# Patient Record
Sex: Male | Born: 1970 | Race: White | Hispanic: No | Marital: Married | Smoking: Never smoker
Health system: Southern US, Community
[De-identification: ages and names within clinical notes are randomized; demographics above are authoritative.]

---

## 2006-05-04 ENCOUNTER — Ambulatory Visit: Payer: Self-pay | Admitting: General Practice

## 2009-11-18 ENCOUNTER — Ambulatory Visit: Payer: Self-pay | Admitting: Unknown Physician Specialty

## 2010-03-28 ENCOUNTER — Ambulatory Visit: Payer: Self-pay | Admitting: Unknown Physician Specialty

## 2010-04-28 ENCOUNTER — Ambulatory Visit: Payer: Self-pay | Admitting: Unknown Physician Specialty

## 2010-05-18 ENCOUNTER — Ambulatory Visit: Payer: Self-pay | Admitting: Unknown Physician Specialty

## 2011-08-21 IMAGING — CR DG C-ARM 1-60 MIN
1 series · 1 of 1 positions shown · non-contrast
Comparison: none

[[id]]
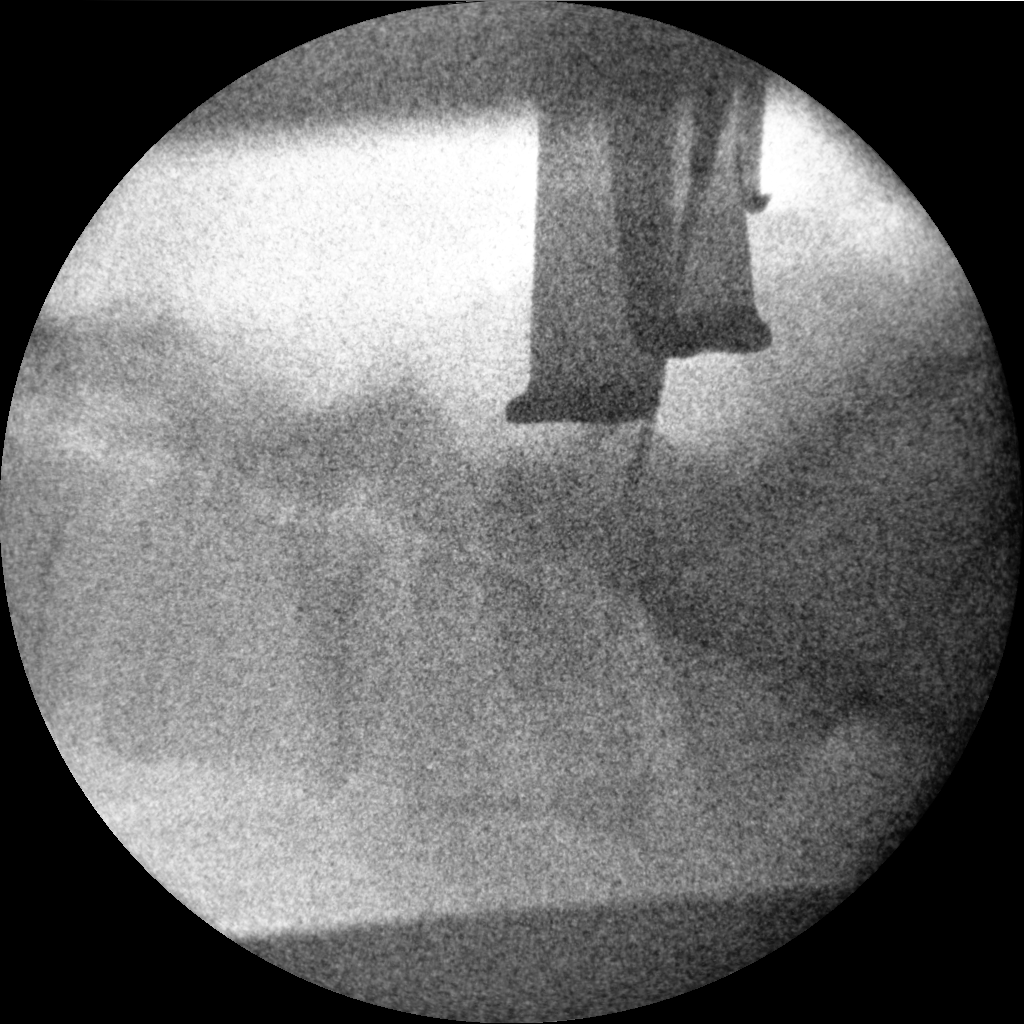

[1 of 1 positions shown; findings below may reference images not displayed]

IMAGES IMPORTED FROM THE [employer] WORKFLOW SYSTEM
NO DICTATION FOR STUDY

## 2013-10-08 HISTORY — PX: BACK SURGERY: SHX140

## 2014-05-03 ENCOUNTER — Ambulatory Visit: Payer: Self-pay | Admitting: Internal Medicine

## 2014-10-08 HISTORY — PX: ACHILLES TENDON REPAIR: SUR1153

## 2015-08-06 IMAGING — CT CT HEAD WITHOUT CONTRAST
1 series · 15 of 30 positions shown, 19 images · non-contrast
Comparison: None.

CLINICAL DATA: Lightheadedness and dizziness for the past 3-4 weeks
with nausea. Three episodes of hitting head over the past month.
Treatment of sinus infection with improvement of symptoms. No
history of cancer.

EXAM:
CT HEAD WITHOUT CONTRAST
TECHNIQUE: Contiguous axial images were obtained from the base of the skull
through the vertex without intravenous contrast.

[Series 2: head wo · axial · 0.42mm/px · z∈[-48,+87]mm · 15 of 30 slices shown, 19 images]
[im 2/30  brain]
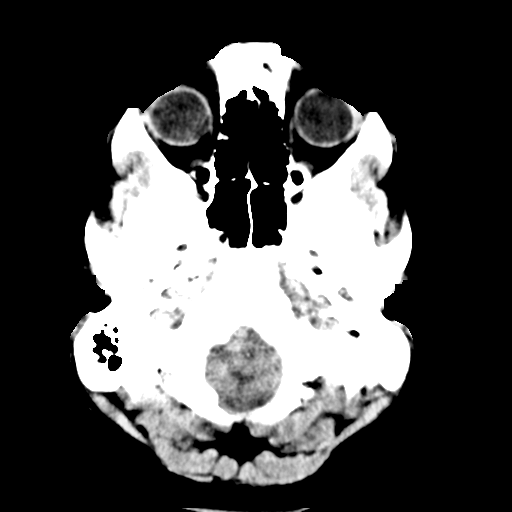
[im 2/30  bone]
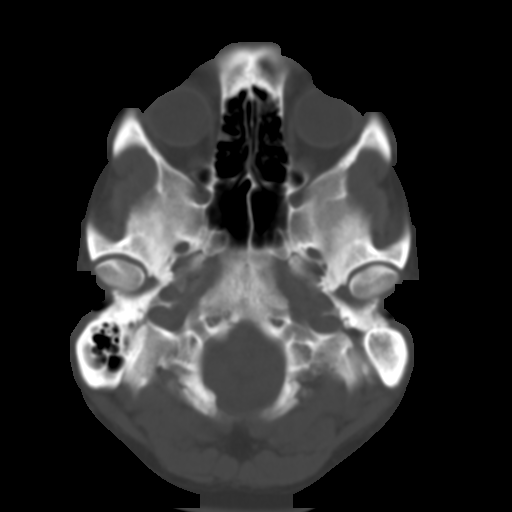
[im 4/30  brain]
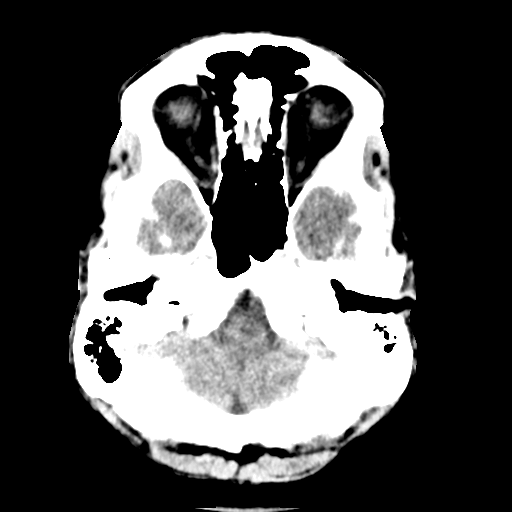
[im 6/30  brain]
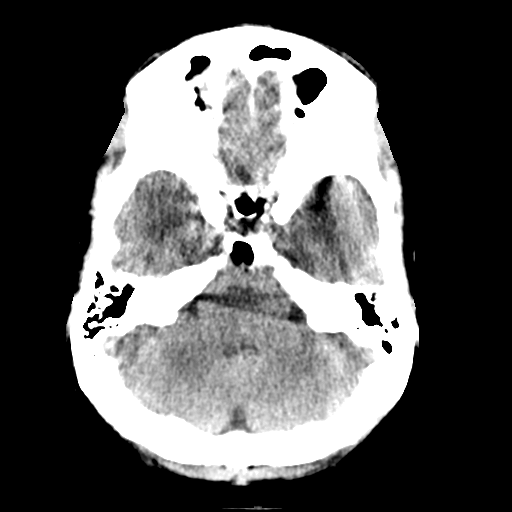
[im 8/30  brain]
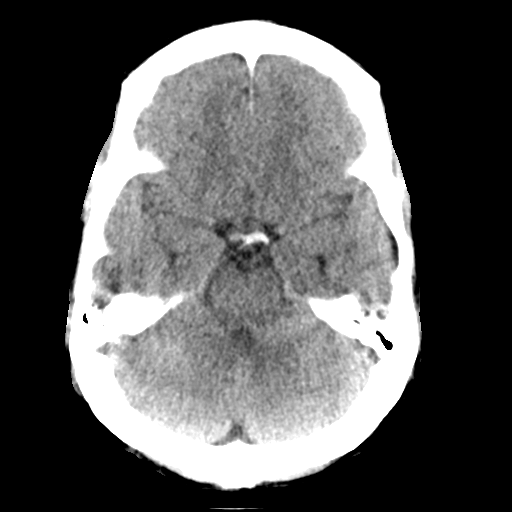
[im 10/30  brain]
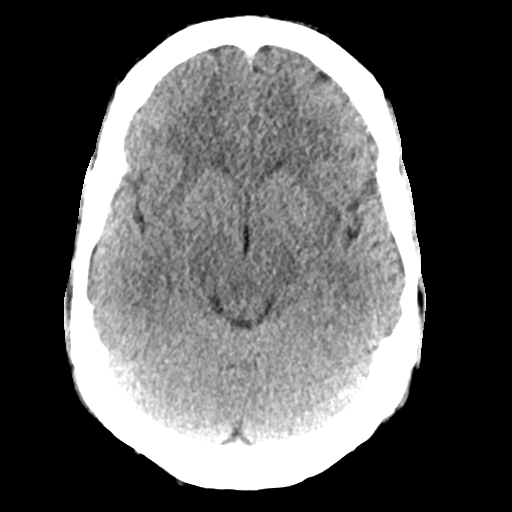
[im 10/30  bone]
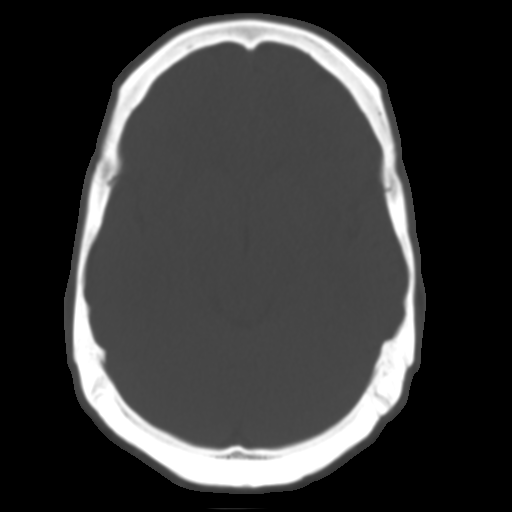
[im 12/30  brain]
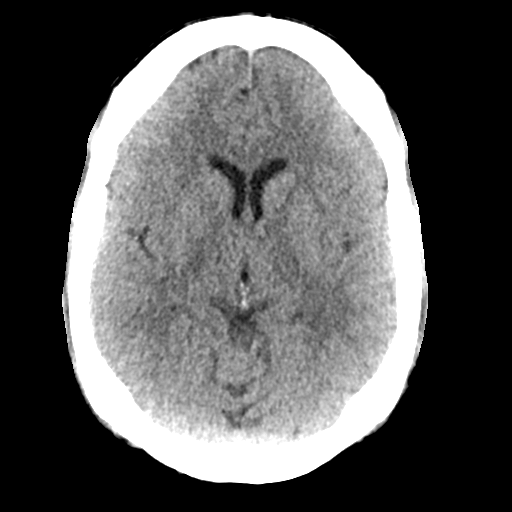
[im 14/30  brain]
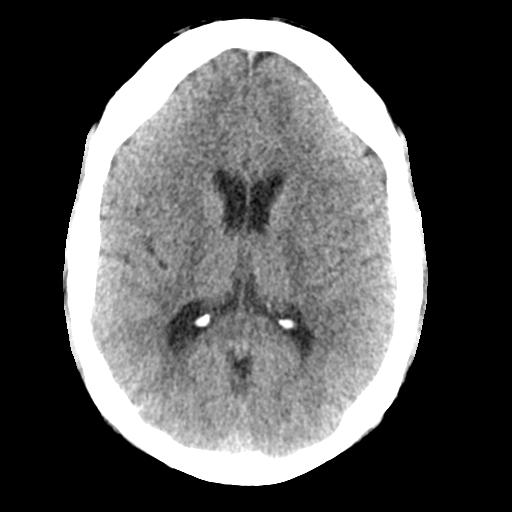
[im 16/30  brain]
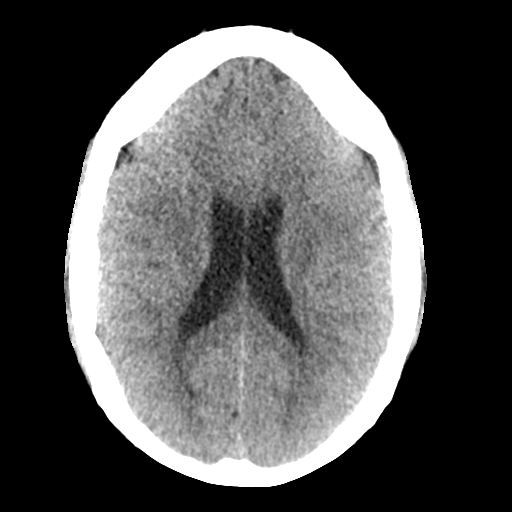
[im 17/30  brain]
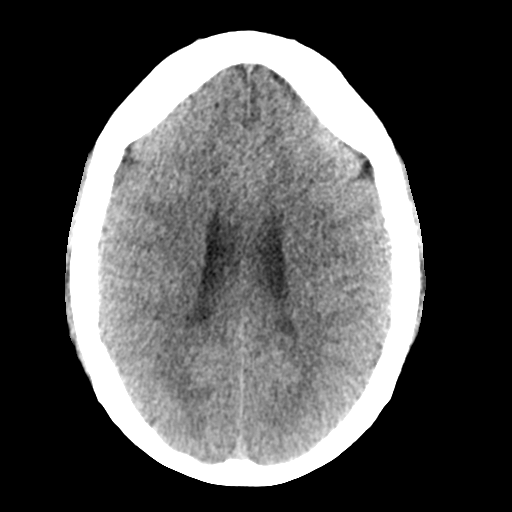
[im 17/30  bone]
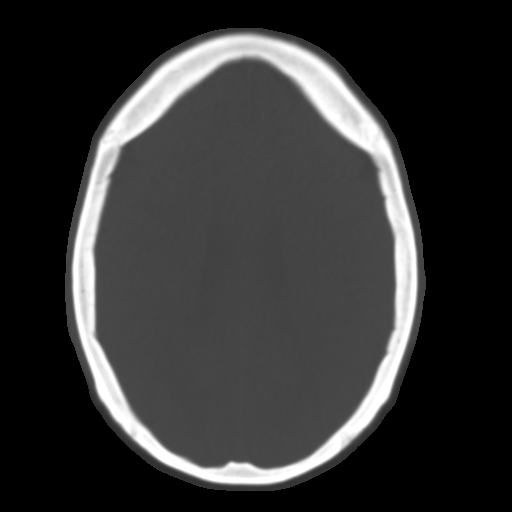
[im 19/30  brain]
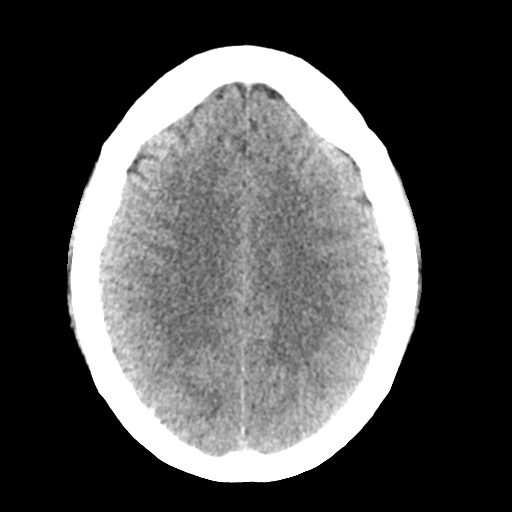
[im 21/30  brain]
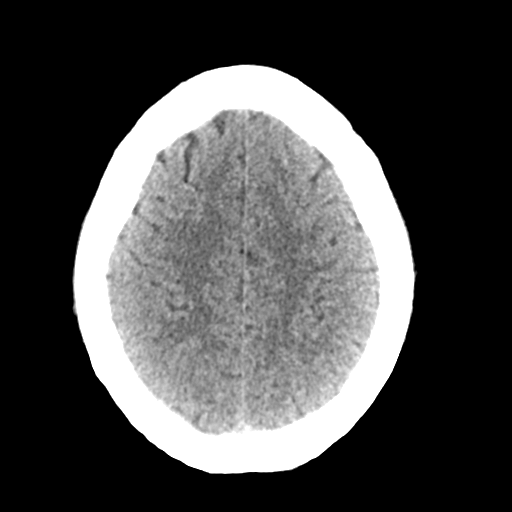
[im 23/30  brain]
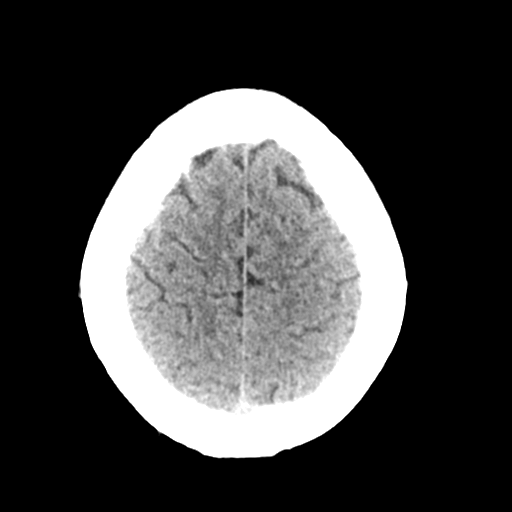
[im 25/30  brain]
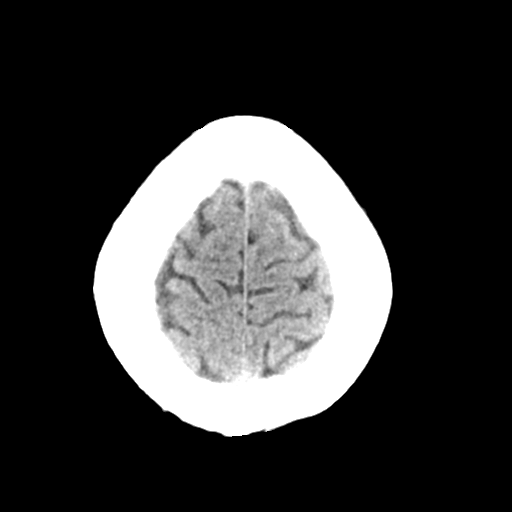
[im 25/30  bone]
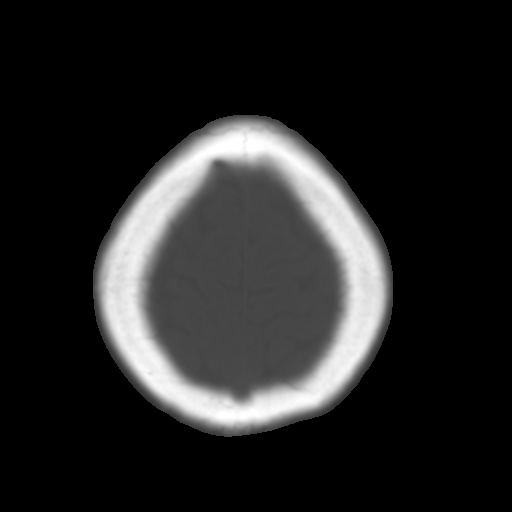
[im 27/30  brain]
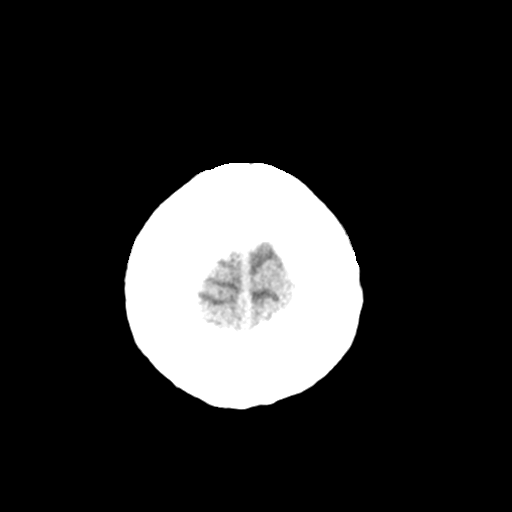
[im 29/30  brain]
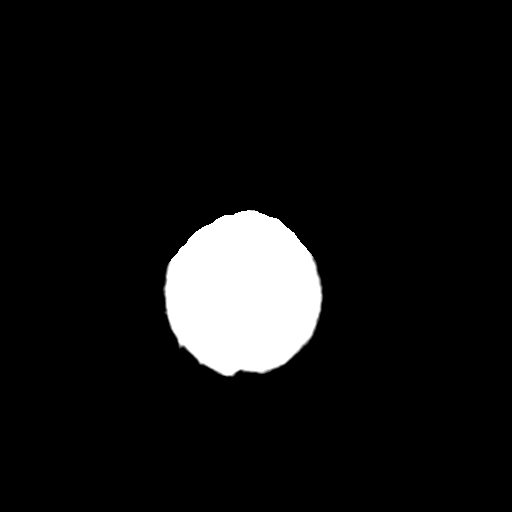

[15 of 30 positions shown; findings below may reference images not displayed]

FINDINGS: No skull fracture or intracranial hemorrhage.

No CT evidence of large acute infarct. If acute infarct
(particularly posterior fossa infarct) is of high clinical concern,
MR imaging may be considered.

No hydrocephalus.

No intracranial mass lesion noted on this unenhanced exam.

Cerebellar tonsils may be minimally low lying although appearing
within the range of normal limits.

Minimal mucosal thickening ethmoid sinus air cells. Mastoid air
cells and middle ear cavities are clear.
IMPRESSION: No skull fracture or intracranial hemorrhage.

No CT evidence of large acute infarct with follow-up as detailed
above.

Minimal mucosal thickening ethmoid sinus air cells.

## 2017-06-25 ENCOUNTER — Other Ambulatory Visit: Payer: Self-pay | Admitting: Orthopedic Surgery

## 2017-06-25 DIAGNOSIS — M25561 Pain in right knee: Secondary | ICD-10-CM

## 2017-09-18 ENCOUNTER — Other Ambulatory Visit: Payer: Self-pay | Admitting: Orthopedic Surgery

## 2017-09-18 ENCOUNTER — Ambulatory Visit
Admission: RE | Admit: 2017-09-18 | Discharge: 2017-09-18 | Disposition: A | Discharge status: Home / Self Care | Payer: Self-pay | Source: Ambulatory Visit | Attending: Orthopedic Surgery | Admitting: Orthopedic Surgery

## 2017-09-18 DIAGNOSIS — M25561 Pain in right knee: Secondary | ICD-10-CM

## 2018-09-17 ENCOUNTER — Other Ambulatory Visit: Payer: Self-pay | Admitting: Orthopedic Surgery

## 2018-09-19 ENCOUNTER — Encounter
Admission: RE | Admit: 2018-09-19 | Discharge: 2018-09-19 | Disposition: A | Discharge status: Home / Self Care | Payer: 59 | Source: Ambulatory Visit | Attending: Orthopedic Surgery | Admitting: Orthopedic Surgery

## 2018-09-19 ENCOUNTER — Other Ambulatory Visit: Payer: Self-pay

## 2018-09-19 NOTE — Patient Instructions (Signed)
Your procedure is scheduled on: Thursday 09/25/18 Report to DAY SURGERY DEPARTMENT LOCATED ON 2ND FLOOR MEDICAL MALL ENTRANCE. To find out your arrival time please call [phone] between 1PM - 3PM on Wednesday 09/24/18.  Remember: Instructions that are not followed completely may result in serious medical risk, up to and including death, or upon the discretion of your surgeon and anesthesiologist your surgery may need to be rescheduled.     _X__ 1. Do not eat food after midnight the night before your procedure.                 No gum chewing or hard candies. You may drink clear liquids up to 2 hours                 before you are scheduled to arrive for your surgery- DO not drink clear                 liquids within 2 hours of the start of your surgery.                 Clear Liquids include:  water, apple juice without pulp, clear carbohydrate                 drink such as Clearfast or Gatorade, Black Coffee or Tea (Do not add                 anything to coffee or tea). Diabetics water only  __X__2.  On the morning of surgery brush your teeth with toothpaste and water, you  may rinse your mouth with mouthwash if you wish.  Do not swallow any              toothpaste of mouthwash.     _X__ 3.  No Alcohol for 24 hours before or after surgery.   _X__ 4.  Do Not Smoke or use e-cigarettes For 24 Hours Prior to Your Surgery.                 Do not use any chewable tobacco products for at least 6 hours prior to                 surgery.  ____  5.  Bring all medications with you on the day of surgery if instructed.   __X__  6.  Notify your doctor if there is any change in your medical condition      (cold, fever, infections).     Do not wear jewelry, make-up, hairpins, clips or nail polish. Do not wear lotions, powders, or perfumes.  Do not shave 48 hours prior to surgery. Men may shave face and neck. Do not bring valuables to the hospital.    [facility] is not responsible for any  belongings or valuables.  Contacts, dentures/partials or body piercings may not be worn into surgery. Bring a case for your contacts, glasses or hearing aids, a denture cup will be supplied. Leave your suitcase in the car. After surgery it may be brought to your room. For patients admitted to the hospital, discharge time is determined by your treatment team.   Patients discharged the day of surgery will not be allowed to drive home.   Please read over the following fact sheets that you were given:   MRSA Information  __X__ Take these medicines the morning of surgery with A SIP OF WATER:    1. gabapentin (NEURONTIN)  2.   3.   4.  5.  6.  ____ Fleet Enema (as directed)   __X__ Use CHG Soap/SAGE wipes as directed  ____ Use inhalers on the day of surgery  ____ Stop metformin/Janumet/Farxiga 2 days prior to surgery    ____ Take 1/2 of usual insulin dose the night before surgery. No insulin the morning          of surgery.   ____ Stop Blood Thinners Coumadin/Plavix/Xarelto/Pleta/Pradaxa/Eliquis/Effient/Aspirin  on   Or contact your Surgeon, Cardiologist or Medical Doctor regarding ability to stop your blood thinners  __X__ Stop Anti-inflammatories 7 days before surgery such as Advil, Ibuprofen, Motrin, BC or Goodies Powder, Naprosyn, Naproxen, Aleve, Aspirin    __X__ Stop all herbal supplements, fish oil or vitamin E  until after surgery.    ____ Bring C-Pap to the hospital.

## 2018-09-22 ENCOUNTER — Encounter
Admission: RE | Admit: 2018-09-22 | Discharge: 2018-09-22 | Disposition: A | Discharge status: Home / Self Care | Payer: 59 | Source: Ambulatory Visit | Attending: Orthopedic Surgery | Admitting: Orthopedic Surgery

## 2018-09-22 DIAGNOSIS — Z01812 Encounter for preprocedural laboratory examination: Secondary | ICD-10-CM | POA: Insufficient documentation

## 2018-09-22 LAB — CBC WITH DIFFERENTIAL/PLATELET
Abs Immature Granulocytes: 0.02 10*3/uL (ref 0.00–0.07)
Basophils Absolute: 0 10*3/uL (ref 0.0–0.1)
Basophils Relative: 1 %
Eosinophils Absolute: 0.1 10*3/uL (ref 0.0–0.5)
Eosinophils Relative: 1 %
HCT: 44.2 % (ref 39.0–52.0)
Hemoglobin: 14.3 g/dL (ref 13.0–17.0)
Immature Granulocytes: 0 %
Lymphocytes Relative: 24 %
Lymphs Abs: 1.4 10*3/uL (ref 0.7–4.0)
MCH: 29.5 pg (ref 26.0–34.0)
MCHC: 32.4 g/dL (ref 30.0–36.0)
MCV: 91.3 fL (ref 80.0–100.0)
Monocytes Absolute: 0.6 10*3/uL (ref 0.1–1.0)
Monocytes Relative: 9 %
Neutro Abs: 3.8 10*3/uL (ref 1.7–7.7)
Neutrophils Relative %: 65 %
Platelets: 280 10*3/uL (ref 150–400)
RBC: 4.84 MIL/uL (ref 4.22–5.81)
RDW: 13 % (ref 11.5–15.5)
WBC: 6 10*3/uL (ref 4.0–10.5)
nRBC: 0 % (ref 0.0–0.2)

## 2018-09-22 LAB — BASIC METABOLIC PANEL
Anion gap: 8 (ref 5–15)
BUN: 16 mg/dL (ref 6–20)
CO2: 29 mmol/L (ref 22–32)
Calcium: 8.9 mg/dL (ref 8.9–10.3)
Chloride: 100 mmol/L (ref 98–111)
Creatinine, Ser: 0.79 mg/dL (ref 0.61–1.24)
GFR calc Af Amer: >60 mL/min (ref >60)
GFR calc non Af Amer: >60 mL/min (ref >60)
Glucose, Bld: 101 mg/dL — ABNORMAL HIGH (ref 70–99)
Potassium: 4.1 mmol/L (ref 3.5–5.1)
Sodium: 137 mmol/L (ref 135–145)

## 2018-09-22 LAB — PROTIME-INR
INR: 0.92
Prothrombin Time: 12.3 seconds (ref 11.4–15.2)

## 2018-09-22 LAB — APTT: aPTT: 33 seconds (ref 24–36)

## 2018-10-06 MED ORDER — CEFAZOLIN SODIUM-DEXTROSE 2-4 GM/100ML-% IV SOLN
2.0000 g | INTRAVENOUS | Status: AC
  Administered 2018-10-07: 2 g via INTRAVENOUS

## 2018-10-07 ENCOUNTER — Other Ambulatory Visit: Payer: Self-pay

## 2018-10-07 ENCOUNTER — Ambulatory Visit: Payer: No Typology Code available for payment source | Admitting: Anesthesiology

## 2018-10-07 ENCOUNTER — Encounter: Payer: Self-pay | Admitting: *Deleted

## 2018-10-07 ENCOUNTER — Encounter
Admission: RE | Disposition: A | Discharge status: Home / Self Care | Payer: Self-pay | Source: Ambulatory Visit | Attending: Orthopedic Surgery

## 2018-10-07 ENCOUNTER — Ambulatory Visit
Admission: RE | Admit: 2018-10-07 | Discharge: 2018-10-07 | Disposition: A | Discharge status: Home / Self Care | Payer: No Typology Code available for payment source | Source: Ambulatory Visit | Attending: Orthopedic Surgery | Admitting: Orthopedic Surgery

## 2018-10-07 DIAGNOSIS — M6751 Plica syndrome, right knee: Secondary | ICD-10-CM | POA: Insufficient documentation

## 2018-10-07 DIAGNOSIS — M1711 Unilateral primary osteoarthritis, right knee: Secondary | ICD-10-CM | POA: Insufficient documentation

## 2018-10-07 DIAGNOSIS — Y939 Activity, unspecified: Secondary | ICD-10-CM | POA: Diagnosis not present

## 2018-10-07 DIAGNOSIS — S83231A Complex tear of medial meniscus, current injury, right knee, initial encounter: Secondary | ICD-10-CM | POA: Insufficient documentation

## 2018-10-07 DIAGNOSIS — S83241A Other tear of medial meniscus, current injury, right knee, initial encounter: Secondary | ICD-10-CM | POA: Diagnosis present

## 2018-10-07 DIAGNOSIS — Z79899 Other long term (current) drug therapy: Secondary | ICD-10-CM | POA: Insufficient documentation

## 2018-10-07 DIAGNOSIS — X58XXXA Exposure to other specified factors, initial encounter: Secondary | ICD-10-CM | POA: Insufficient documentation

## 2018-10-07 HISTORY — PX: KNEE ARTHROSCOPY: SHX127

## 2018-10-07 SURGERY — ARTHROSCOPY, KNEE
Anesthesia: General | Site: Knee | Laterality: Right

## 2018-10-07 MED ORDER — FAMOTIDINE 20 MG PO TABS
ORAL_TABLET | ORAL | Status: AC
  Filled 2018-10-07: qty 1

## 2018-10-07 MED ORDER — LACTATED RINGERS IV SOLN
INTRAVENOUS | Status: DC
  Administered 2018-10-07: 125 mL/h via INTRAVENOUS

## 2018-10-07 MED ORDER — DEXAMETHASONE SODIUM PHOSPHATE 10 MG/ML IJ SOLN
INTRAMUSCULAR | Status: DC | PRN
  Administered 2018-10-07: 8 mg via INTRAVENOUS

## 2018-10-07 MED ORDER — SODIUM CHLORIDE FLUSH 0.9 % IV SOLN
INTRAVENOUS | Status: AC
  Filled 2018-10-07: qty 10

## 2018-10-07 MED ORDER — CEFAZOLIN SODIUM-DEXTROSE 2-4 GM/100ML-% IV SOLN
INTRAVENOUS | Status: AC
  Filled 2018-10-07: qty 100

## 2018-10-07 MED ORDER — PROMETHAZINE HCL 25 MG/ML IJ SOLN
INTRAMUSCULAR | Status: AC
  Administered 2018-10-07: 6.25 mg via INTRAVENOUS
  Filled 2018-10-07: qty 1

## 2018-10-07 MED ORDER — ASPIRIN EC 325 MG PO TBEC: 325.0000 mg | DELAYED_RELEASE_TABLET | Freq: Every day | ORAL | 0 refills | Status: AC

## 2018-10-07 MED ORDER — FAMOTIDINE 20 MG PO TABS
20.0000 mg | ORAL_TABLET | Freq: Once | ORAL | Status: AC
  Administered 2018-10-07: 20 mg via ORAL

## 2018-10-07 MED ORDER — LIDOCAINE HCL (PF) 1 % IJ SOLN
INTRAMUSCULAR | Status: DC | PRN
  Administered 2018-10-07: 5 mL

## 2018-10-07 MED ORDER — FENTANYL CITRATE (PF) 100 MCG/2ML IJ SOLN
INTRAMUSCULAR | Status: AC
  Filled 2018-10-07: qty 2

## 2018-10-07 MED ORDER — ONDANSETRON HCL 4 MG PO TABS: 4.0000 mg | ORAL_TABLET | Freq: Three times a day (TID) | ORAL | 0 refills | Status: AC | PRN

## 2018-10-07 MED ORDER — CHLORHEXIDINE GLUCONATE CLOTH 2 % EX PADS: 6.0000 | MEDICATED_PAD | Freq: Once | CUTANEOUS | Status: DC

## 2018-10-07 MED ORDER — LIDOCAINE HCL (CARDIAC) PF 100 MG/5ML IV SOSY
PREFILLED_SYRINGE | INTRAVENOUS | Status: DC | PRN
  Administered 2018-10-07: 100 mg via INTRAVENOUS

## 2018-10-07 MED ORDER — ONDANSETRON HCL 4 MG/2ML IJ SOLN
INTRAMUSCULAR | Status: DC | PRN
  Administered 2018-10-07: 4 mg via INTRAVENOUS

## 2018-10-07 MED ORDER — BUPIVACAINE-EPINEPHRINE (PF) 0.25% -1:200000 IJ SOLN
INTRAMUSCULAR | Status: DC | PRN
  Administered 2018-10-07: 30 mL

## 2018-10-07 MED ORDER — FENTANYL CITRATE (PF) 100 MCG/2ML IJ SOLN
INTRAMUSCULAR | Status: DC | PRN
  Administered 2018-10-07 (×4): 50 ug via INTRAVENOUS

## 2018-10-07 MED ORDER — MIDAZOLAM HCL 2 MG/2ML IJ SOLN
INTRAMUSCULAR | Status: DC | PRN
  Administered 2018-10-07: 2 mg via INTRAVENOUS

## 2018-10-07 MED ORDER — PROPOFOL 10 MG/ML IV BOLUS
INTRAVENOUS | Status: DC | PRN
  Administered 2018-10-07: 200 mg via INTRAVENOUS

## 2018-10-07 MED ORDER — PROMETHAZINE HCL 25 MG/ML IJ SOLN
6.2500 mg | INTRAMUSCULAR | Status: DC | PRN
  Administered 2018-10-07: 6.25 mg via INTRAVENOUS

## 2018-10-07 MED ORDER — FENTANYL CITRATE (PF) 100 MCG/2ML IJ SOLN
25.0000 ug | INTRAMUSCULAR | Status: DC | PRN
  Administered 2018-10-07: 50 ug via INTRAVENOUS

## 2018-10-07 MED ORDER — OXYCODONE HCL 5 MG PO TABS: 5.0000 mg | ORAL_TABLET | ORAL | 0 refills | Status: AC | PRN

## 2018-10-07 SURGICAL SUPPLY — 38 items
ADAPTER IRRIG TUBE 2 SPIKE SOL (ADAPTER) ×4 IMPLANT
BUR RADIUS 3.5 (BURR) IMPLANT
BUR RADIUS 4.0X18.5 (BURR) ×2 IMPLANT
COOLER POLAR GLACIER W/PUMP (MISCELLANEOUS) ×2 IMPLANT
COVER WAND RF STERILE (DRAPES) IMPLANT
CUFF TOURN 24 STER (MISCELLANEOUS) IMPLANT
CUFF TOURN 30 STER DUAL PORT (MISCELLANEOUS) ×2 IMPLANT
DRAPE IMP U-DRAPE 54X76 (DRAPES) ×2 IMPLANT
DURAPREP 26ML APPLICATOR (WOUND CARE) ×6 IMPLANT
GAUZE PETRO XEROFOAM 1X8 (MISCELLANEOUS) ×2 IMPLANT
GAUZE SPONGE 4X4 12PLY STRL (GAUZE/BANDAGES/DRESSINGS) ×2 IMPLANT
GLOVE BIOGEL PI IND STRL 9 (GLOVE) ×1 IMPLANT
GLOVE BIOGEL PI INDICATOR 9 (GLOVE) ×1
GLOVE SURG 9.0 ORTHO LTXF (GLOVE) ×4 IMPLANT
GOWN STRL REUS W/ TWL LRG LVL3 (GOWN DISPOSABLE) ×2 IMPLANT
GOWN STRL REUS W/TWL 2XL LVL3 (GOWN DISPOSABLE) ×2 IMPLANT
GOWN STRL REUS W/TWL LRG LVL3 (GOWN DISPOSABLE) ×2
IV LACTATED RINGER IRRG 3000ML (IV SOLUTION) ×8
IV LR IRRIG 3000ML ARTHROMATIC (IV SOLUTION) ×8 IMPLANT
KIT TURNOVER KIT A (KITS) ×2 IMPLANT
MANIFOLD NEPTUNE II (INSTRUMENTS) ×2 IMPLANT
MAT ABSORB  FLUID 56X50 GRAY (MISCELLANEOUS) ×1
MAT ABSORB FLUID 56X50 GRAY (MISCELLANEOUS) ×1 IMPLANT
NEEDLE HYPO 22GX1.5 SAFETY (NEEDLE) ×2 IMPLANT
PACK ARTHROSCOPY KNEE (MISCELLANEOUS) ×2 IMPLANT
PAD ABD DERMACEA PRESS 5X9 (GAUZE/BANDAGES/DRESSINGS) ×4 IMPLANT
PAD WRAPON POLAR KNEE (MISCELLANEOUS) ×1 IMPLANT
SET TUBE SUCT SHAVER OUTFL 24K (TUBING) ×2 IMPLANT
SOL PREP PVP 2OZ (MISCELLANEOUS) ×2
SOLUTION PREP PVP 2OZ (MISCELLANEOUS) ×1 IMPLANT
STRIP CLOSURE SKIN 1/2X4 (GAUZE/BANDAGES/DRESSINGS) ×2 IMPLANT
SUT ETHILON 4-0 (SUTURE) ×1
SUT ETHILON 4-0 FS2 18XMFL BLK (SUTURE) ×1
SUTURE ETHLN 4-0 FS2 18XMF BLK (SUTURE) ×1 IMPLANT
TUBING ARTHRO INFLOW-ONLY STRL (TUBING) ×2 IMPLANT
WAND HAND CNTRL MULTIVAC 50 (MISCELLANEOUS) IMPLANT
WAND HAND CNTRL MULTIVAC 90 (MISCELLANEOUS) ×2 IMPLANT
WRAPON POLAR PAD KNEE (MISCELLANEOUS) ×2

## 2018-10-07 NOTE — Anesthesia Post-op Follow-up Note (Signed)
Anesthesia QCDR form completed.        

## 2018-10-07 NOTE — H&P (Signed)
PREOPERATIVE H&P  Chief Complaint: TEAR OF MEDIAL MENISCUS OF RIGHT KNEE  HPI: William AcostaJoel Hopple is a 47 y.o. male who presents for preoperative history and physical with a diagnosis of TEAR OF MEDIAL MENISCUS OF RIGHT KNEE. Symptoms of right knee pain have been persistent despite non-operative treatment and are rated as moderate to severe, and have been worsening.  This is significantly impairing activities of daily living.  He has elected for surgical management.   History reviewed. No pertinent past medical history. Past Surgical History:  Procedure Laterality Date  . ACHILLES TENDON REPAIR Left 2016  . BACK SURGERY  2015   lower back herniated disc repair   Social History   Socioeconomic History  . Marital status: Married    Spouse name: angela  . Number of children: Not on file  . Years of education: Not on file  . Highest education level: Not on file  Occupational History  . Occupation: it training  Social Needs  . Financial resource strain: Not on file  . Food insecurity:    Worry: Not on file    Inability: Not on file  . Transportation needs:    Medical: Not on file    Non-medical: Not on file  Tobacco Use  . Smoking status: Never Smoker  . Smokeless tobacco: Never Used  Substance and Sexual Activity  . Alcohol use: Not Currently  . Drug use: Never  . Sexual activity: Not on file  Lifestyle  . Physical activity:    Days per week: Not on file    Minutes per session: Not on file  . Stress: Not on file  Relationships  . Social connections:    Talks on phone: Not on file    Gets together: Not on file    Attends religious service: Not on file    Active member of club or organization: Not on file    Attends meetings of clubs or organizations: Not on file    Relationship status: Not on file  Other Topics Concern  . Not on file  Social History Narrative  . Not on file   History reviewed. No pertinent family history. No Known Allergies Prior to Admission  medications   Medication Sig Start Date End Date Taking? Authorizing Provider  gabapentin (NEURONTIN) 300 MG capsule Take 300 mg by mouth 2 (two) times daily. 09/08/18  Yes [provider]     Positive ROS: All other systems have been reviewed and were otherwise negative with the exception of those mentioned in the HPI and as above.  Physical Exam: General: Alert, no acute distress Cardiovascular: Regular rate and rhythm, no murmurs rubs or gallops.  No pedal edema Respiratory: Clear to auscultation bilaterally, no wheezes rales or rhonchi. No cyanosis, no use of accessory musculature GI: No organomegaly, abdomen is soft and non-tender nondistended with positive bowel sounds. Skin: Skin intact, no lesions within the operative field. Neurologic: Sensation intact distally Psychiatric: Patient is competent for consent with normal mood and affect Lymphatic: No cervical lymphadenopathy  MUSCULOSKELETAL: Right knee:  Mild effusion, no erythema or ecchymosis.  Knee ROM 0-120.  No ligamentous instability.  + medial jt line tenderness.  + Mcmurrays test.  NVI.  Assessment: TEAR OF MEDIAL MENISCUS OF RIGHT KNEE  Plan: Plan for Procedure(s): ARTHROSCOPY RIGHT KNEE WITH PARTIAL MEDIAL OR LATERAL MENISCECTOMY  I discussed the procedure in detail with the patient along with the post-operative course.   I discussed the risks and benefits of surgery. He understands the risks  include but are not limited to infection, bleeding, nerve or blood vessel injury, joint stiffness or loss of motion, persistent pain, weakness or instability and the need for further surgery. Medical risks include but are not limited to DVT and pulmonary embolism, myocardial infarction, stroke, pneumonia, respiratory failure and death. Patient understood these risks and wished to proceed.     Juanell FairlyKRASINSKI, Mikle Sternberg, MD   10/07/2018 9:27 AM

## 2018-10-07 NOTE — Discharge Instructions (Signed)
AMBULATORY SURGERY  DISCHARGE INSTRUCTIONS   1) The drugs that you were given will stay in your system until tomorrow so for the next 24 hours you should not:  A) Drive an automobile B) Make any legal decisions C) Drink any alcoholic beverage   2) You may resume regular meals tomorrow.  Today it is better to start with liquids and gradually work up to solid foods.  You may eat anything you prefer, but it is better to start with liquids, then soup and crackers, and gradually work up to solid foods.   3) Please notify your doctor immediately if you have any unusual bleeding, trouble breathing, redness and pain at the surgery site, drainage, fever, or pain not relieved by medication.    4) Additional Instructions:        Please contact your physician with any problems or Same Day Surgery at [phone], Monday through Friday 6 am to 4 pm, or [place] at [facility] Main number at [phone].  Knee Arthroscopy, Care After This sheet gives you information about how to care for yourself after your procedure. Your health care provider may also give you more specific instructions. If you have problems or questions, contact your health care provider. What can I expect after the procedure? After the procedure, it is common to have:  Soreness.  Swelling.  Pain that can be relieved by taking pain medicine. Follow these instructions at home: Incision care   Follow instructions from your health care provider about how to take care of your incisions. Make sure you: ? Wash your hands with soap and water before you change your bandage (dressing). If soap and water are not available, use hand sanitizer. ? Change your dressing as told by your health care provider. ? Leave stitches (sutures), staples, skin glue, or adhesive strips in place. These skin closures may need to stay in place for 2 weeks or longer. If adhesive strip edges start to loosen and curl up, you may trim the loose  edges. Do not remove adhesive strips completely unless your health care provider tells you to do that.  Check your incision areas every day for signs of infection. Check for: ? Redness. ? More swelling or pain. ? Fluid or blood. ? Warmth. ? Pus or a bad smell. Bathing  Do not take baths, swim, or use a hot tub until your health care provider approves. Ask your health care provider if you may take showers. You may only be allowed to take sponge baths. Activity  Do not use your knee to support your body weight until your health care provider says that you can. Follow weight-bearing restrictions as told. Use crutches or other devices to help you move around (assistive devices) as directed.  Ask your health care provider what activities are safe for you during recovery, and what activities you need to avoid.  If physical therapy was prescribed, do exercises as directed. Doing exercises may help improve knee movement and flexibility (range of motion).  Do not lift anything that is heavier than 10 lb (4.5 kg), or the limit that you are told, until your health care provider says that it is safe. Driving  Do not drive until your health care provider approves. You may be able to drive after 1-3 weeks.  Do not drive or use heavy machinery while taking prescription pain medicine. Managing pain, stiffness, and swelling   If directed, put ice on the injured area: ? Put ice in a plastic bag or use  the icing device (cold therapy unit) that you were given. Follow instructions from your health care provider about how to use the icing device. ? Place a towel between your skin and the bag or between your skin and the icing device. ? Leave the ice on for 20 minutes, 2-3 times a day.  Move your toes often to avoid stiffness and to lessen swelling.  Raise (elevate) the injured area above the level of your heart while you are sitting or lying down. If you are taking blood thinners:  Before you take  any medicines that contain aspirin or NSAIDs, talk with your health care provider. These medicines increase your risk for dangerous bleeding.  Take your medicine exactly as told, at the same time every day.  Avoid activities that could cause injury or bruising, and follow instructions about how to prevent falls.  Wear a medical alert bracelet or carry a card that lists what medicines you take. General instructions  Take over-the-counter and prescription medicines only as told by your health care provider.  If you are taking prescription pain medicine, take actions to prevent or treat constipation. Your health care provider may recommend that you: ? Drink enough fluid to keep your urine pale yellow. ? Eat foods that are high in fiber, such as fresh fruits and vegetables, whole grains, and beans. ? Limit foods that are high in fat and processed sugars, such as fried or sweet foods. ? Take an over-the-counter or prescription medicines for constipation.  Do not use any products that contain nicotine or tobacco, such as cigarettes and e-cigarettes. These can delay incision or bone healing. If you need help quitting, ask your health care provider.  Wear compression stockings as told by your health care provider. These stockings help to prevent blood clots and reduce swelling in your legs.  Keep all follow-up visits as told by your health care provider. This is important. Contact a health care provider if you:  Have a fever.  Have severe pain.  Have redness around an incision.  Have more swelling.  Have fluid or blood coming from an incision.  Notice that an incision feels warm to the touch.  Notice pus or a bad smell coming from an incision.  Notice that an incision opens up.  Develop a rash. Get help right away if you:  Have difficulty breathing.  Have shortness of breath.  Have chest pain.  Develop pain in your lower leg or at the back of your knee.  Have numbness or  tingling in your lower leg or your foot. Summary  Raise (elevate) the injured area above the level of your heart while you are sitting or lying down.  To help relieve pain and swelling, put ice on your leg for 20 minutes at a time, 2-3 times a day.  If you were prescribed a blood thinner, avoid activities that could cause injury or bruising, and follow instructions about how to prevent falls.  If physical therapy was prescribed, do exercises as directed. Doing exercises may help improve range of motion. This information is not intended to replace advice given to you by your health care provider. Make sure you discuss any questions you have with your health care provider. Document Released: 04/13/2005 Document Revised: 08/07/2017 Document Reviewed: 08/07/2017 Elsevier Interactive Patient Education  2019 [employer]

## 2018-10-07 NOTE — Transfer of Care (Signed)
Immediate Anesthesia Transfer of Care Note  Patient: Staci AcostaJoel Bahri  Procedure(s) Performed: ARTHROSCOPY KNEE WITH PARTIAL MEDIAL OR LATERAL MENISCECTOMY (Right Knee)  Patient Location: PACU  Anesthesia Type:General  Level of Consciousness: awake and alert   Airway & Oxygen Therapy: Patient Spontanous Breathing and Patient connected to face mask oxygen  Post-op Assessment: Report given to RN and Post -op Vital signs reviewed and stable  Post vital signs: Reviewed and stable  Last Vitals:  Vitals Value Taken Time  BP    Temp    Pulse    Resp    SpO2      Last Pain:  Vitals:   10/07/18 0831  TempSrc: Tympanic  PainSc: 2       Patients Stated Pain Goal: 1 (10/07/18 0831)  Complications: No apparent anesthesia complications

## 2018-10-07 NOTE — Anesthesia Preprocedure Evaluation (Signed)
Anesthesia Evaluation  Patient identified by MRN, date of birth, ID band Patient awake    Reviewed: Allergy & Precautions, H&P , NPO status , Patient's Chart, lab work & pertinent test results, reviewed documented beta blocker date and time   History of Anesthesia Complications Negative for: history of anesthetic complications  Airway Mallampati: III  TM Distance: >3 FB Neck ROM: full  Mouth opening: Limited Mouth Opening  Dental  (+) Dental Advidsory Given, Caps, Teeth Intact, Missing   Pulmonary neg shortness of breath, neg sleep apnea, neg COPD, Recent URI , Residual Cough,           Cardiovascular Exercise Tolerance: Good negative cardio ROS       Neuro/Psych negative neurological ROS  negative psych ROS   GI/Hepatic negative GI ROS, Neg liver ROS,   Endo/Other  negative endocrine ROS  Renal/GU negative Renal ROS  negative genitourinary   Musculoskeletal   Abdominal   Peds  Hematology negative hematology ROS (+)   Anesthesia Other Findings History reviewed. No pertinent past medical history.   Reproductive/Obstetrics negative OB ROS                             Anesthesia Physical Anesthesia Plan  ASA: I  Anesthesia Plan: General   Post-op Pain Management:    Induction: Intravenous  PONV Risk Score and Plan: 2 and Ondansetron, Dexamethasone, Midazolam, Treatment may vary due to age or medical condition and Promethazine  Airway Management Planned: LMA  Additional Equipment:   Intra-op Plan:   Post-operative Plan: Extubation in OR  Informed Consent: I have reviewed the patients History and Physical, chart, labs and discussed the procedure including the risks, benefits and alternatives for the proposed anesthesia with the patient or authorized representative who has indicated his/her understanding and acceptance.   Dental Advisory Given  Plan Discussed with:  Anesthesiologist, CRNA and Surgeon  Anesthesia Plan Comments:         Anesthesia Quick Evaluation

## 2018-10-07 NOTE — Anesthesia Procedure Notes (Signed)
Procedure Name: LMA Insertion Date/Time: 10/07/2018 9:43 AM Performed by: Iran PlanasHolmes, Joseph Bias P, CRNA Pre-anesthesia Checklist: Patient identified Patient Re-evaluated:Patient Re-evaluated prior to induction Oxygen Delivery Method: Circle system utilized Preoxygenation: Pre-oxygenation with 100% oxygen Induction Type: IV induction Ventilation: Mask ventilation without difficulty LMA: LMA inserted LMA Size: 5.0 Number of attempts: 1

## 2018-10-07 NOTE — Op Note (Signed)
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 male with a diagnosis of TEAR OF MEDIAL MENISCUS of the RIGHT KNEE who failed conservative management and elected for surgical management.   The risks benefits and alternatives were discussed with the patient preoperatively including the risks of infection, bleeding, nerve injury, knee stiffness, persistent pain, osteoarthritis and the need for further surgery. Medical  risks include DVT and pulmonary embolism, myocardial infarction, stroke, pneumonia, respiratory failure and death. The patient understood these risks and wished to proceed.  OPERATIVE FINDINGS: Patient had extensive complex degenerative tearing of the posterior horn and body of the medial meniscus.  Patient also had tricompartmental osteoarthritis most extensive in the trochlea and medial compartment.  OPERATIVE PROCEDURE: Patient was met in the preoperative area. The operative extremity was signed with the word yes and my initials according the hospital's correct site of surgery protocol.  Preop history and physical was performed at the bedside in the preoperative area.  The right knee was signed with my initials and the word yes according to the hospital's correct site of surgery protocol.  The patient was brought to the operating room where they was placed supine on the operative table. General anesthesia was administered. The patient was prepped and draped in a sterile fashion.  A timeout was performed to verify the  patient's name, date of birth, medical record number, correct site of surgery correct procedure to be performed. It was also used to verify the patient received antibiotics that all appropriate instruments, and radiographic studies were available in the room. Once all in attendance were in agreement, the case began.  Proposed arthroscopy incisions were drawn out with a surgical marker. These were pre-injected with 1% lidocaine plain. An 11 blade was used to establish an inferior lateral and inferomedial portals. The inferomedial portal was created using a 18-gauge spinal needle under direct visualization.  A full diagnostic examination of the knee was performed including the suprapatellar pouch, patellofemoral joint, medial lateral compartments as well as the medial lateral gutters, the intercondylar notch in the posterior knee.  Patient had the meniscal tear of the body and posterior horn treated with a 4-0 resector shaver blade and straight duckbill basket. The meniscus was debrided until a stable rim was achieved. A chondroplasty of the medial femoral condyle, medial tibial plateau, trochlea and undersurface of patella was also performed using a 4-0 resector shaver blade. A partial synovectomy was also performed using a 4-0 resector shaver blade and 90 ArthroCare wand.  Resection of a suprapatellar plica was also performed with a 90 degree ArthroCare wand.  The knee was then copiously lavaged. All arthroscopic instruments were removed. The 2 arthroscopy portals were closed with 4-0 nylon. Steri-Strips were applied along with a dry sterile and compressive dressing. The patient was brought to the PACU in stable condition. I was scrubbed and present for the entire case and all sharp and instrument counts were correct at the conclusion the case. I spoke with the patient's wife postoperatively to let her know the case was performed without complication and the patient was stable in the recovery  room.  Timoteo Gaul, MD

## 2018-10-10 ENCOUNTER — Encounter: Payer: Self-pay | Admitting: Orthopedic Surgery

## 2018-10-10 NOTE — Anesthesia Postprocedure Evaluation (Signed)
Anesthesia Post Note  Patient: William Cannon  Procedure(s) Performed: ARTHROSCOPY KNEE WITH PARTIAL MEDIAL  MENISCECTOMY,CHRONDROPLASTY (Right Knee)  Patient location during evaluation: PACU Anesthesia Type: General Level of consciousness: awake and alert Pain management: pain level controlled Vital Signs Assessment: post-procedure vital signs reviewed and stable Respiratory status: spontaneous breathing, nonlabored ventilation, respiratory function stable and patient connected to nasal cannula oxygen Cardiovascular status: blood pressure returned to baseline and stable Postop Assessment: no apparent nausea or vomiting Anesthetic complications: no     Last Vitals:  Vitals:   10/07/18 1204 10/07/18 1315  BP: 135/82 123/68  Pulse: 68 67  Resp: 19 18  Temp: (!) 36.1 C 36.6 C  SpO2: 99% 98%    Last Pain:  Vitals:   10/09/18 0813  TempSrc:   PainSc: 2                  Lenard SimmerAndrew Allex Lapoint

## 2023-05-07 ENCOUNTER — Other Ambulatory Visit: Payer: Self-pay | Admitting: Internal Medicine

## 2023-05-07 DIAGNOSIS — G8929 Other chronic pain: Secondary | ICD-10-CM

## 2023-05-18 ENCOUNTER — Ambulatory Visit
Admission: RE | Admit: 2023-05-18 | Discharge: 2023-05-18 | Disposition: A | Discharge status: Home / Self Care | Payer: No Typology Code available for payment source | Source: Ambulatory Visit | Attending: Internal Medicine | Admitting: Internal Medicine

## 2023-05-18 DIAGNOSIS — G8929 Other chronic pain: Secondary | ICD-10-CM
